# Patient Record
Sex: Female | Born: 1985 | Race: White | Hispanic: No | Marital: Single | Smoking: Current every day smoker
Health system: Southern US, Community
[De-identification: ages and names within clinical notes are randomized; demographics above are authoritative.]

## PROBLEM LIST (undated history)

## (undated) DIAGNOSIS — M549 Dorsalgia, unspecified: Secondary | ICD-10-CM

---

## 2008-08-05 ENCOUNTER — Emergency Department (HOSPITAL_COMMUNITY): Admission: EM | Admit: 2008-08-05 | Discharge: 2008-08-05 | Payer: Self-pay | Admitting: Family Medicine

## 2008-11-16 ENCOUNTER — Emergency Department (HOSPITAL_COMMUNITY): Admission: EM | Admit: 2008-11-16 | Discharge: 2008-11-16 | Payer: Self-pay | Admitting: Emergency Medicine

## 2009-06-17 ENCOUNTER — Emergency Department (HOSPITAL_COMMUNITY): Admission: EM | Admit: 2009-06-17 | Discharge: 2009-06-17 | Payer: Self-pay | Admitting: Emergency Medicine

## 2009-12-15 ENCOUNTER — Ambulatory Visit (HOSPITAL_COMMUNITY): Admission: RE | Admit: 2009-12-15 | Discharge: 2009-12-15 | Payer: Self-pay | Admitting: Obstetrics & Gynecology

## 2009-12-31 ENCOUNTER — Ambulatory Visit (HOSPITAL_COMMUNITY): Admission: RE | Admit: 2009-12-31 | Discharge: 2009-12-31 | Payer: Self-pay | Admitting: Internal Medicine

## 2010-05-28 ENCOUNTER — Inpatient Hospital Stay (HOSPITAL_COMMUNITY): Admission: AD | Admit: 2010-05-28 | Discharge: 2010-05-30 | Payer: Self-pay | Admitting: Obstetrics & Gynecology

## 2011-03-06 LAB — CBC
HCT: 32 % — ABNORMAL LOW (ref 36.0–46.0)
HCT: 35.6 % — ABNORMAL LOW (ref 36.0–46.0)
Hemoglobin: 11.1 g/dL — ABNORMAL LOW (ref 12.0–15.0)
MCHC: 35 g/dL (ref 30.0–36.0)
MCV: 93.5 fL (ref 78.0–100.0)
MCV: 93.8 fL (ref 78.0–100.0)
Platelets: 177 10*3/uL (ref 150–400)
Platelets: 203 10*3/uL (ref 150–400)
RDW: 13.9 % (ref 11.5–15.5)
RDW: 14.1 % (ref 11.5–15.5)
WBC: 17.5 10*3/uL — ABNORMAL HIGH (ref 4.0–10.5)

## 2011-03-26 LAB — URINALYSIS, ROUTINE W REFLEX MICROSCOPIC: Glucose, UA: NEGATIVE mg/dL

## 2011-03-26 LAB — RPR: RPR Ser Ql: NONREACTIVE

## 2011-03-26 LAB — URINE MICROSCOPIC-ADD ON

## 2011-03-26 LAB — WET PREP, GENITAL: Trich, Wet Prep: NONE SEEN

## 2011-03-26 LAB — PREGNANCY, URINE: Preg Test, Ur: NEGATIVE

## 2011-09-06 ENCOUNTER — Ambulatory Visit (INDEPENDENT_AMBULATORY_CARE_PROVIDER_SITE_OTHER): Payer: Self-pay

## 2011-09-06 ENCOUNTER — Inpatient Hospital Stay (INDEPENDENT_AMBULATORY_CARE_PROVIDER_SITE_OTHER)
Admission: RE | Admit: 2011-09-06 | Discharge: 2011-09-06 | Disposition: A | Discharge status: Home / Self Care | Payer: Self-pay | Source: Ambulatory Visit | Attending: Emergency Medicine | Admitting: Emergency Medicine

## 2011-09-06 DIAGNOSIS — M549 Dorsalgia, unspecified: Secondary | ICD-10-CM

## 2011-09-06 DIAGNOSIS — T490X1A Poisoning by local antifungal, anti-infective and anti-inflammatory drugs, accidental (unintentional), initial encounter: Secondary | ICD-10-CM

## 2011-09-06 LAB — POCT URINALYSIS DIP (DEVICE)
Glucose, UA: NEGATIVE mg/dL
Nitrite: NEGATIVE
Protein, ur: NEGATIVE mg/dL
Urobilinogen, UA: 0.2 mg/dL (ref 0.0–1.0)

## 2011-09-06 LAB — HEPATIC FUNCTION PANEL
Albumin: 4.1 g/dL (ref 3.5–5.2)
Alkaline Phosphatase: 95 U/L (ref 39–117)
Total Bilirubin: 0.3 mg/dL (ref 0.3–1.2)

## 2011-09-06 LAB — POCT I-STAT, CHEM 8
Calcium, Ion: 1.29 mmol/L (ref 1.12–1.32)
Glucose, Bld: 99 mg/dL (ref 70–99)
HCT: 44 % (ref 36.0–46.0)
Hemoglobin: 15 g/dL (ref 12.0–15.0)
TCO2: 24 mmol/L (ref 0–100)

## 2011-09-06 LAB — SEDIMENTATION RATE: Sed Rate: 7 mm/hr (ref 0–22)

## 2013-06-02 IMAGING — CR DG THORACIC SPINE 2V
3 series · 3 of 3 positions shown · non-contrast
Comparison: None.

CLINICAL DATA: Back pain

THORACIC SPINE - 2 VIEW

[view not recorded (1 of 3)]
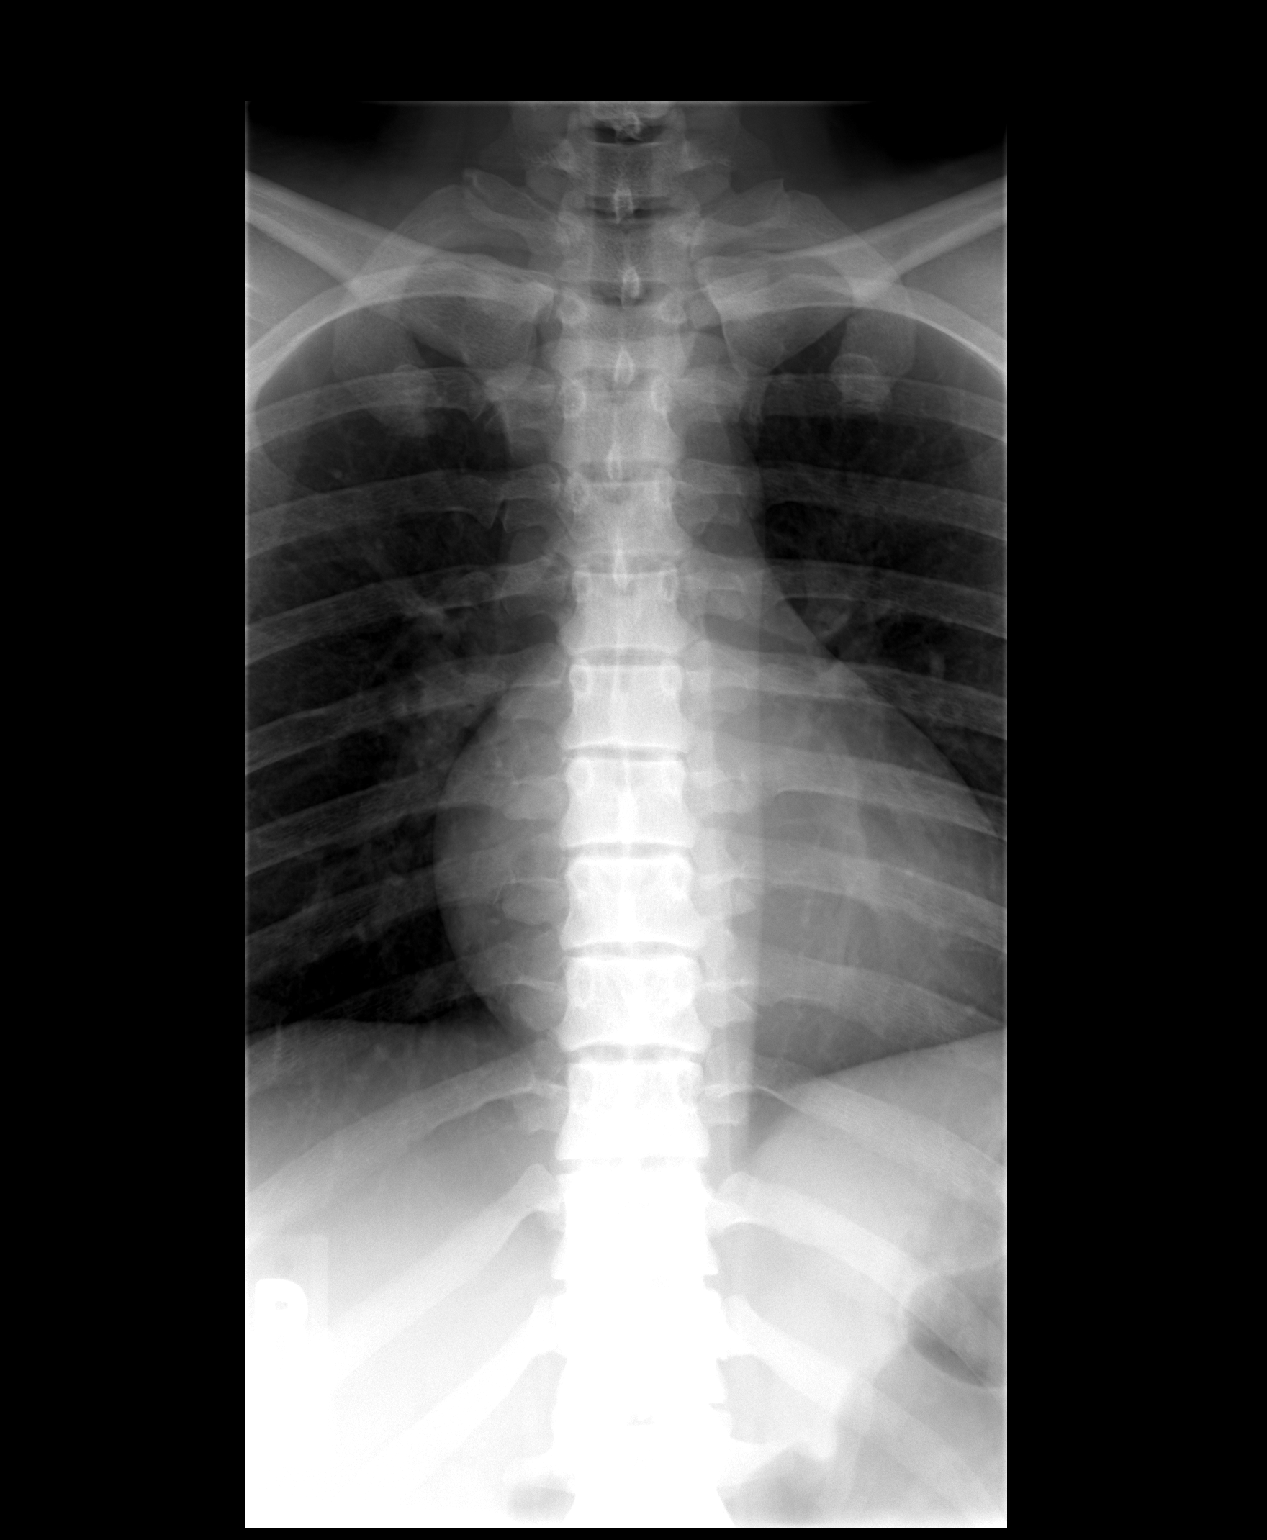

[view not recorded (2 of 3)]
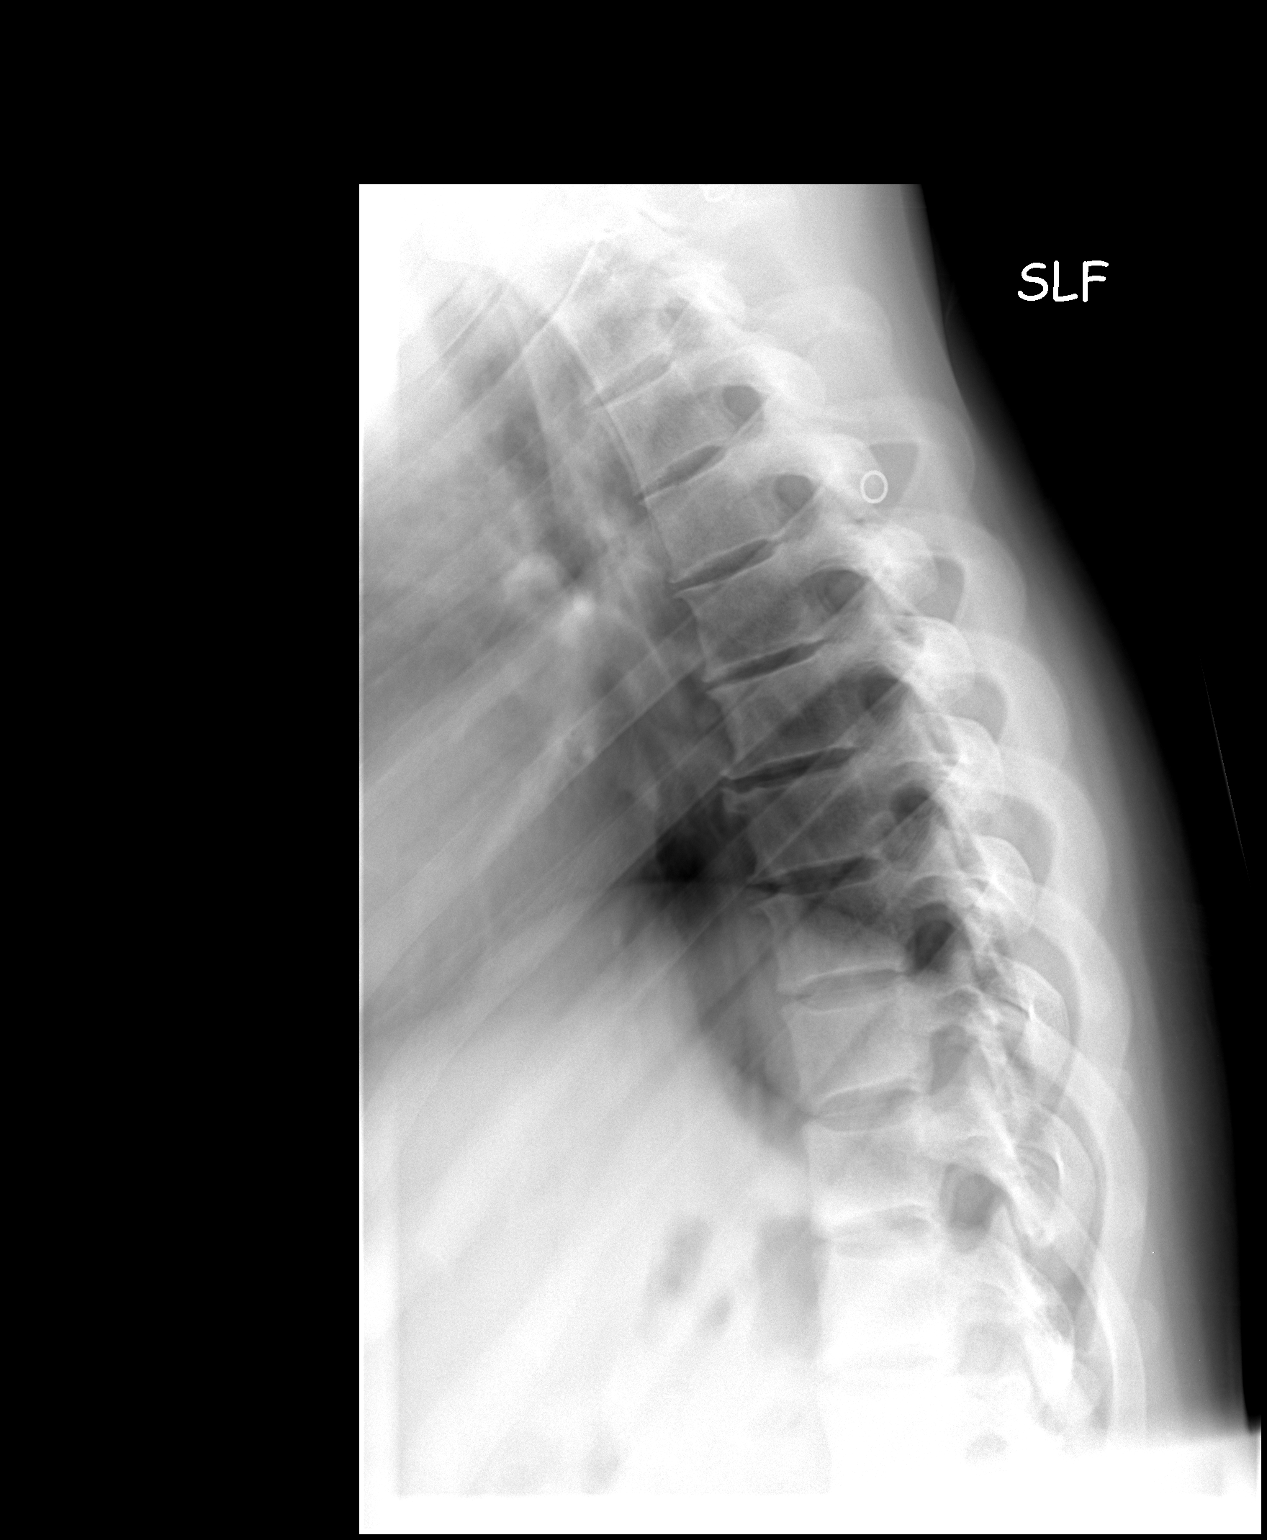

[view not recorded (3 of 3)]
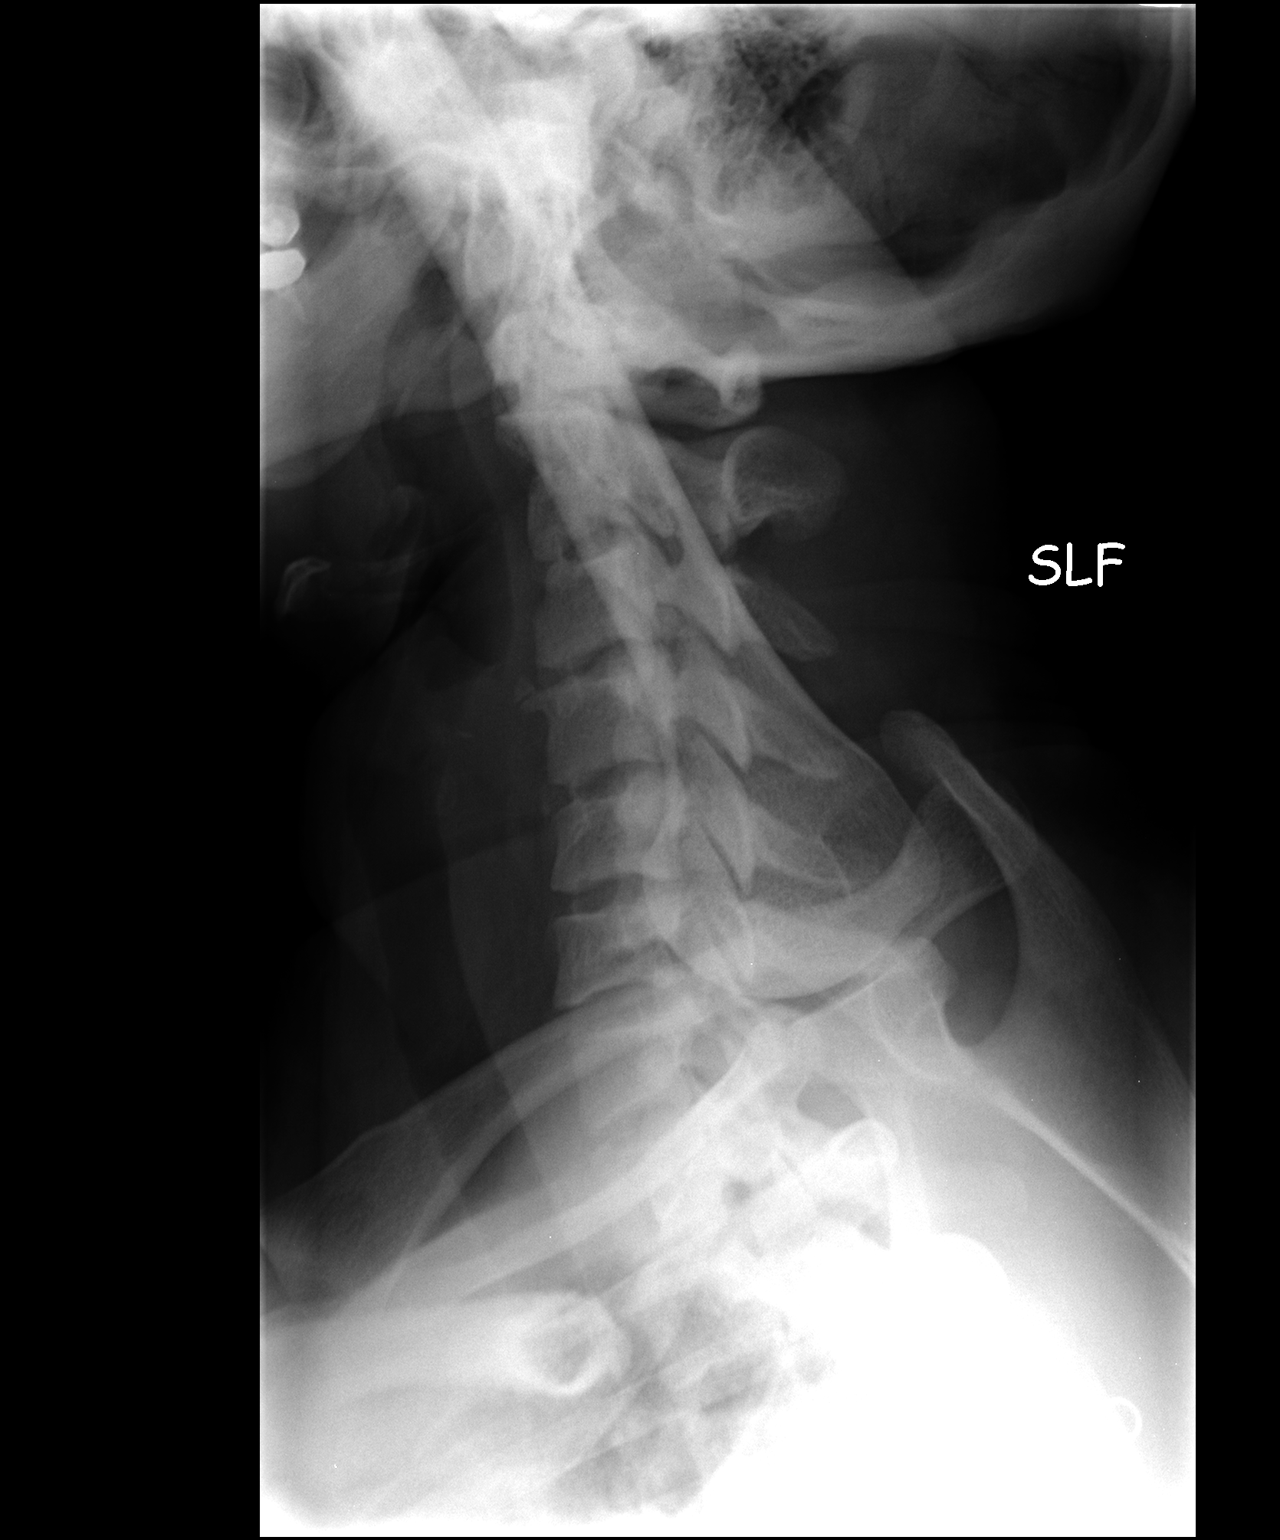

[3 of 3 positions shown; findings below may reference images not displayed]

FINDINGS: Anatomic alignment.  No acute fracture and no
dislocation.
IMPRESSION: No acute bony pathology.

## 2013-12-18 ENCOUNTER — Encounter (HOSPITAL_COMMUNITY): Payer: Self-pay | Admitting: Emergency Medicine

## 2013-12-18 ENCOUNTER — Emergency Department (INDEPENDENT_AMBULATORY_CARE_PROVIDER_SITE_OTHER)
Admission: EM | Admit: 2013-12-18 | Discharge: 2013-12-18 | Disposition: A | Discharge status: Home / Self Care | Payer: Self-pay | Source: Home / Self Care | Attending: Emergency Medicine | Admitting: Emergency Medicine

## 2013-12-18 DIAGNOSIS — N390 Urinary tract infection, site not specified: Secondary | ICD-10-CM

## 2013-12-18 LAB — POCT URINALYSIS DIP (DEVICE)
Bilirubin Urine: NEGATIVE
Glucose, UA: NEGATIVE mg/dL
KETONES UR: NEGATIVE mg/dL
Nitrite: POSITIVE — AB
PROTEIN: NEGATIVE mg/dL
Specific Gravity, Urine: >=1.03 (ref 1.005–1.030)
Urobilinogen, UA: 0.2 mg/dL (ref 0.0–1.0)
pH: 5.5 (ref 5.0–8.0)

## 2013-12-18 LAB — POCT PREGNANCY, URINE: Preg Test, Ur: NEGATIVE

## 2013-12-18 MED ORDER — PHENAZOPYRIDINE HCL 200 MG PO TABS: 200.0000 mg | ORAL_TABLET | Freq: Three times a day (TID) | ORAL | Status: DC

## 2013-12-18 MED ORDER — PHENAZOPYRIDINE HCL 200 MG PO TABS
200.0000 mg | ORAL_TABLET | Freq: Three times a day (TID) | ORAL | Status: DC
Start: 2013-12-18 — End: 2013-12-18

## 2013-12-18 MED ORDER — CEPHALEXIN 500 MG PO CAPS: 1000.0000 mg | ORAL_CAPSULE | Freq: Two times a day (BID) | ORAL | Status: DC

## 2013-12-18 NOTE — ED Provider Notes (Signed)
CSN: [id number]     Arrival date & time 12/18/13  1109 History   First MD Initiated Contact with Patient 12/18/13 1209     Chief Complaint  Patient presents with  . Urinary Tract Infection   (Consider location/radiation/quality/duration/timing/severity/associated sxs/prior Treatment) HPI Comments:  28 year old female presents complaining of probable urinary tract infection. Starting last night, she started to have pain and burning with urination, urinary urgency, urinary frequency, and some mild lower abdominal discomfort. She has had a urinary tract in the past that started out just like this. She denies fever, chills, flank pain, NVD. Denies risk factors for STD   History reviewed. No pertinent past medical history. No past surgical history on file. No family history on file. History  Substance Use Topics  . Smoking status: Not on file  . Smokeless tobacco: Not on file  . Alcohol Use: Not on file   OB History   Grav Para Term Preterm Abortions TAB SAB Ect Mult Living                 Review of Systems  Constitutional: Negative for fever and chills.  Eyes: Negative for visual disturbance.  Respiratory: Negative for cough and shortness of breath.   Cardiovascular: Negative for chest pain, palpitations and leg swelling.  Gastrointestinal: Negative for nausea, vomiting and abdominal pain.  Endocrine: Negative for polydipsia and polyuria.  Genitourinary: Positive for dysuria, urgency and frequency. Negative for hematuria, flank pain, decreased urine volume, vaginal discharge, difficulty urinating and genital sores.  Musculoskeletal: Negative for arthralgias and myalgias.  Skin: Negative for rash.  Neurological: Negative for dizziness, weakness and light-headedness.    Allergies  Review of patient's allergies indicates no known allergies.  Home Medications   Current Outpatient Rx  Name  Route  Sig  Dispense  Refill  . cephALEXin (KEFLEX) 500 MG capsule   Oral   Take 2  capsules (1,000 mg total) by mouth 2 (two) times daily.   20 capsule   0   . phenazopyridine (PYRIDIUM) 200 MG tablet   Oral   Take 1 tablet (200 mg total) by mouth 3 (three) times daily.   6 tablet   0    BP 128/70  Pulse 70  Temp(Src) 98.6 F (37 C) (Oral)  Resp 16  SpO2 100%  LMP 12/17/2013 Physical Exam  Nursing note and vitals reviewed. Constitutional: She is oriented to person, place, and time. Vital signs are normal. She appears well-developed and well-nourished. No distress.  HENT:  Head: Normocephalic and atraumatic.  Pulmonary/Chest: Effort normal. No respiratory distress.  Abdominal: Soft. There is no tenderness. There is no CVA tenderness.  Neurological: She is alert and oriented to person, place, and time. She has normal strength. Coordination normal.  Skin: Skin is warm and dry. No rash noted. She is not diaphoretic.  Psychiatric: She has a normal mood and affect. Judgment normal.    ED Course  Procedures (including critical care time) Labs Review Labs Reviewed  POCT URINALYSIS DIP (DEVICE) - Abnormal; Notable for the following:    Hgb urine dipstick MODERATE (*)    Nitrite POSITIVE (*)    Leukocytes, UA SMALL (*)    All other components within normal limits  URINE CULTURE  POCT PREGNANCY, URINE   Imaging Review No results found.    MDM   1. UTI (lower urinary tract infection)    Urinalysis indicates a UTI. Culture sent. Treat with Keflex and Pyridium. Followup when necessary.  Meds ordered this encounter  Medications  . cephALEXin (KEFLEX) 500 MG capsule    Sig: Take 2 capsules (1,000 mg total) by mouth 2 (two) times daily.    Dispense:  20 capsule    Refill:  0  . phenazopyridine (PYRIDIUM) 200 MG tablet    Sig: Take 1 tablet (200 mg total) by mouth 3 (three) times daily.    Dispense:  6 tablet    Refill:  0       Graylon GoodZachary H Acsa Estey, PA-C 12/18/13 1237

## 2013-12-18 NOTE — ED Notes (Signed)
C/o uti which started last night States she is having some burning when urinating, the urgency and frequency feeling and also abd discomfort.  No treatments tried.

## 2013-12-18 NOTE — Discharge Instructions (Signed)
Urinary Tract Infection  Urinary tract infections (UTIs) can develop anywhere along your urinary tract. Your urinary tract is your body's drainage system for removing wastes and extra water. Your urinary tract includes two kidneys, two ureters, a bladder, and a urethra. Your kidneys are a pair of bean-shaped organs. Each kidney is about the size of your fist. They are located below your ribs, one on each side of your spine.  CAUSES  Infections are caused by microbes, which are microscopic organisms, including fungi, viruses, and bacteria. These organisms are so small that they can only be seen through a microscope. Bacteria are the microbes that most commonly cause UTIs.  SYMPTOMS   Symptoms of UTIs may vary by age and gender of the patient and by the location of the infection. Symptoms in young women typically include a frequent and intense urge to urinate and a painful, burning feeling in the bladder or urethra during urination. Older women and men are more likely to be tired, shaky, and weak and have muscle aches and abdominal pain. A fever may mean the infection is in your kidneys. Other symptoms of a kidney infection include pain in your back or sides below the ribs, nausea, and vomiting.  DIAGNOSIS  To diagnose a UTI, your caregiver will ask you about your symptoms. Your caregiver also will ask to provide a urine sample. The urine sample will be tested for bacteria and white blood cells. White blood cells are made by your body to help fight infection.  TREATMENT   Typically, UTIs can be treated with medication. Because most UTIs are caused by a bacterial infection, they usually can be treated with the use of antibiotics. The choice of antibiotic and length of treatment depend on your symptoms and the type of bacteria causing your infection.  HOME CARE INSTRUCTIONS   If you were prescribed antibiotics, take them exactly as your caregiver instructs you. Finish the medication even if you feel better after you  have only taken some of the medication.   Drink enough water and fluids to keep your urine clear or pale yellow.   Avoid caffeine, tea, and carbonated beverages. They tend to irritate your bladder.   Empty your bladder often. Avoid holding urine for long periods of time.   Empty your bladder before and after sexual intercourse.   After a bowel movement, women should cleanse from front to back. Use each tissue only once.  SEEK MEDICAL CARE IF:    You have back pain.   You develop a fever.   Your symptoms do not begin to resolve within 3 days.  SEEK IMMEDIATE MEDICAL CARE IF:    You have severe back pain or lower abdominal pain.   You develop chills.   You have nausea or vomiting.   You have continued burning or discomfort with urination.  MAKE SURE YOU:    Understand these instructions.   Will watch your condition.   Will get help right away if you are not doing well or get worse.  Document Released: 09/13/2005 Document Revised: 06/04/2012 Document Reviewed: 01/12/2012  [employer] Patient Information 2014 [employer].

## 2013-12-18 NOTE — ED Provider Notes (Signed)
Medical screening examination/treatment/procedure(s) were performed by non-physician practitioner and as supervising physician I was immediately available for consultation/collaboration.  Leslee Homeavid Tigerlily Christine, M.D.  Reuben Likesavid C Lucillie Kiesel, MD 12/18/13 2025

## 2013-12-20 LAB — URINE CULTURE

## 2013-12-20 NOTE — ED Notes (Signed)
Urine culture: >100,000 colonies E. Coli.  Pt. adequately treated with Keflex. Vassie MoselleYork, Carlissa Pesola M 12/20/2013

## 2014-10-14 ENCOUNTER — Emergency Department (HOSPITAL_COMMUNITY): Payer: Medicaid Other

## 2014-10-14 ENCOUNTER — Encounter (HOSPITAL_COMMUNITY): Payer: Self-pay | Admitting: Emergency Medicine

## 2014-10-14 ENCOUNTER — Emergency Department (HOSPITAL_COMMUNITY)
Admission: EM | Admit: 2014-10-14 | Discharge: 2014-10-14 | Disposition: A | Discharge status: Home / Self Care | Payer: Medicaid Other | Attending: Emergency Medicine | Admitting: Emergency Medicine

## 2014-10-14 ENCOUNTER — Other Ambulatory Visit: Payer: Self-pay

## 2014-10-14 DIAGNOSIS — M549 Dorsalgia, unspecified: Secondary | ICD-10-CM | POA: Diagnosis not present

## 2014-10-14 DIAGNOSIS — R000 Tachycardia, unspecified: Secondary | ICD-10-CM | POA: Diagnosis not present

## 2014-10-14 DIAGNOSIS — R079 Chest pain, unspecified: Secondary | ICD-10-CM | POA: Insufficient documentation

## 2014-10-14 DIAGNOSIS — R52 Pain, unspecified: Secondary | ICD-10-CM

## 2014-10-14 DIAGNOSIS — Z3202 Encounter for pregnancy test, result negative: Secondary | ICD-10-CM | POA: Diagnosis not present

## 2014-10-14 DIAGNOSIS — Z720 Tobacco use: Secondary | ICD-10-CM | POA: Diagnosis not present

## 2014-10-14 HISTORY — DX: Dorsalgia, unspecified: M54.9

## 2014-10-14 LAB — CBC
HEMATOCRIT: 39.2 % (ref 36.0–46.0)
Hemoglobin: 13.1 g/dL (ref 12.0–15.0)
MCH: 30.1 pg (ref 26.0–34.0)
MCHC: 33.4 g/dL (ref 30.0–36.0)
MCV: 90.1 fL (ref 78.0–100.0)
Platelets: 220 10*3/uL (ref 150–400)
RBC: 4.35 MIL/uL (ref 3.87–5.11)
RDW: 13 % (ref 11.5–15.5)
WBC: 9.5 10*3/uL (ref 4.0–10.5)

## 2014-10-14 LAB — BASIC METABOLIC PANEL
Anion gap: 13 (ref 5–15)
BUN: 11 mg/dL (ref 6–23)
CALCIUM: 9.2 mg/dL (ref 8.4–10.5)
CO2: 21 mEq/L (ref 19–32)
Chloride: 101 mEq/L (ref 96–112)
Creatinine, Ser: 0.62 mg/dL (ref 0.50–1.10)
GFR calc Af Amer: >90 mL/min (ref >90)
GLUCOSE: 93 mg/dL (ref 70–99)
Potassium: 4.3 mEq/L (ref 3.7–5.3)
Sodium: 135 mEq/L — ABNORMAL LOW (ref 137–147)

## 2014-10-14 LAB — D-DIMER, QUANTITATIVE: D-Dimer, Quant: 1.27 ug/mL-FEU — ABNORMAL HIGH (ref 0.00–0.48)

## 2014-10-14 LAB — I-STAT TROPONIN, ED: Troponin i, poc: 0.01 ng/mL (ref 0.00–0.08)

## 2014-10-14 LAB — POC URINE PREG, ED: Preg Test, Ur: NEGATIVE

## 2014-10-14 MED ORDER — MORPHINE SULFATE 4 MG/ML IJ SOLN: 4.0000 mg | Freq: Once | INTRAMUSCULAR | Status: DC

## 2014-10-14 MED ORDER — MELOXICAM 7.5 MG PO TABS: 15.0000 mg | ORAL_TABLET | Freq: Every day | ORAL | Status: DC

## 2014-10-14 MED ORDER — IOHEXOL 350 MG/ML SOLN
100.0000 mL | Freq: Once | INTRAVENOUS | Status: AC | PRN
  Administered 2014-10-14: 80 mL via INTRAVENOUS

## 2014-10-14 MED ORDER — KETOROLAC TROMETHAMINE 30 MG/ML IJ SOLN
30.0000 mg | Freq: Once | INTRAMUSCULAR | Status: AC
  Administered 2014-10-14: 30 mg via INTRAVENOUS
  Filled 2014-10-14: qty 1

## 2014-10-14 NOTE — ED Provider Notes (Signed)
CSN: [id number]     Arrival date & time 10/14/14  1015 History   First MD Initiated Contact with Patient 10/14/14 1036     Chief Complaint  Patient presents with  . Chest Pain  . Back Pain     (Consider location/radiation/quality/duration/timing/severity/associated sxs/prior Treatment) HPI Comments: Patient is a 28 yo F PMHx significant for back pain, tobacco abuse presenting to the ED for central sharp constant chest pain that is worse with inspiration. Patient states her pain began last evening, it has improved mildly with time and Advil PM. No aggravating factors. Patient states she is unsure if her pain is radiating through from her back or not. No history of CP, PE, DVT. No early familial cardiac risk factors. Denies any fever or chills.   Patient is a 28 y.o. female presenting with chest pain and back pain.  Chest Pain Associated symptoms: back pain   Associated symptoms: no shortness of breath   Back Pain Associated symptoms: chest pain     Past Medical History  Diagnosis Date  . Back pain    History reviewed. No pertinent past surgical history. No family history on file. History  Substance Use Topics  . Smoking status: Current Every Day Smoker -- 1.00 packs/day    Types: Cigarettes  . Smokeless tobacco: Not on file  . Alcohol Use: Yes   OB History   Grav Para Term Preterm Abortions TAB SAB Ect Mult Living                 Review of Systems  Respiratory: Negative for shortness of breath.   Cardiovascular: Positive for chest pain. Negative for leg swelling.  Musculoskeletal: Positive for back pain.  All other systems reviewed and are negative.     Allergies  Review of patient's allergies indicates no known allergies.  Home Medications   Prior to Admission medications   Medication Sig Start Date End Date Taking? Authorizing Provider  Ibuprofen-Diphenhydramine HCl (ADVIL PM) 200-25 MG CAPS Take 1 tablet by mouth at bedtime as needed (for sleep).    Yes  Historical Provider, MD  meloxicam (MOBIC) 7.5 MG tablet Take 2 tablets (15 mg total) by mouth daily. 10/14/14   Kahli Mayon L Demetrias Goodbar, PA-C   BP 105/60  Pulse 90  Temp(Src) 98.4 F (36.9 C) (Oral)  Resp 18  Ht 5\' 6"  (1.676 m)  Wt 185 lb (83.915 kg)  BMI 29.87 kg/m2  SpO2 98%  LMP 10/04/2014 Physical Exam  Nursing note and vitals reviewed. Constitutional: She is oriented to person, place, and time. She appears well-developed and well-nourished. No distress.  HENT:  Head: Normocephalic and atraumatic.  Right Ear: External ear normal.  Left Ear: External ear normal.  Nose: Nose normal.  Mouth/Throat: Oropharynx is clear and moist.  Eyes: Conjunctivae are normal.  Neck: Neck supple.  Cardiovascular: Regular rhythm, normal heart sounds and intact distal pulses.  Tachycardia present.   Pulmonary/Chest: Effort normal and breath sounds normal. No respiratory distress. She exhibits no tenderness.  Abdominal: Soft. There is no tenderness.  Musculoskeletal: Normal range of motion. She exhibits no edema.  Neurological: She is alert and oriented to person, place, and time.  Skin: Skin is warm and dry. She is not diaphoretic.    ED Course  Procedures (including critical care time) Medications  iohexol (OMNIPAQUE) 350 MG/ML injection 100 mL (80 mLs Intravenous Contrast Given 10/14/14 1338)  ketorolac (TORADOL) 30 MG/ML injection 30 mg (30 mg Intravenous Given 10/14/14 1451)    Labs Review  Labs Reviewed  BASIC METABOLIC PANEL - Abnormal; Notable for the following:    Sodium 135 (*)    All other components within normal limits  D-DIMER, QUANTITATIVE - Abnormal; Notable for the following:    D-Dimer, Quant 1.27 (*)    All other components within normal limits  CBC  I-STAT TROPOININ, ED  POC URINE PREG, ED    Imaging Review Dg Chest 2 View  10/14/2014   CLINICAL DATA:  Chest pain for 1 day.  Cough  EXAM: CHEST  2 VIEW  COMPARISON:  None.  FINDINGS: Lungs are clear. Heart size  and pulmonary vascularity are normal. No adenopathy. No pneumothorax. No bone lesions.  IMPRESSION: No abnormality noted.   Electronically Signed   By: Bretta BangWilliam  Woodruff M.D.   On: 10/14/2014 11:26   Ct Angio Chest W/cm &/or Wo Cm  10/14/2014   CLINICAL DATA:  Chest pain.  EXAM: CT ANGIOGRAPHY CHEST WITH CONTRAST  TECHNIQUE: Multidetector CT imaging of the chest was performed using the standard protocol during bolus administration of intravenous contrast. Multiplanar CT image reconstructions and MIPs were obtained to evaluate the vascular anatomy.  CONTRAST:  80mL OMNIPAQUE IOHEXOL 350 MG/ML SOLN  COMPARISON:  Chest x-ray dated 10/14/2014  FINDINGS: There are no pulmonary emboli, infiltrates, or effusions. Heart is normal. No mediastinal or hilar adenopathy. Osseous structures are normal.  Review of the MIP images confirms the above findings.  IMPRESSION: Normal exam.   Electronically Signed   By: Geanie CooleyJim  Maxwell M.D.   On: 10/14/2014 14:05     EKG Interpretation None      Discuss elevated d-dimer with patient, discussed that we will obtain a CT angio chest for evaluation of elevated d-dimer for possible pulmonary embolism. Patient is agreeable to plan.  MDM   Final diagnoses:  Chest pain    Filed Vitals:   10/14/14 1515  BP: 105/60  Pulse: 90  Temp:   Resp: 18   Afebrile, NAD, non-toxic appearing, AAOx4.  Patient is to be discharged with recommendation to follow up with PCP in regards to today's hospital visit. Chest pain is not likely of cardiac or pulmonary etiology d/t presentation, D-dimer elevated, but CT angio chest negative for PE or other acute cardiopulmonary findings, VSS, no tracheal deviation, no JVD or new murmur, RRR, breath sounds equal bilaterally, EKG without acute abnormalities, negative troponin, and negative CXR. Pt has been advised to return to the ED is CP becomes exertional, associated with diaphoresis or nausea, radiates to left jaw/arm, worsens or becomes concerning  in any way. Pt appears reliable for follow up and is agreeable to discharge.   Case has been discussed with Dr. Fayrene FearingJames who agrees with the above plan to discharge.       Jeannetta EllisJennifer L Alyssha Housh, PA-C 10/14/14 1610  Rolland PorterMark James, MD 10/23/14 [phone]

## 2014-10-14 NOTE — ED Notes (Signed)
Patient returned from CT

## 2014-10-14 NOTE — ED Notes (Signed)
Patient states she has chronic back pain, but that last night it was worsened "and seemed to shoot straight through my chest".   Patient states still has mild chest pain at this time.   Patient states worse when she "breathes in".   Patient denies other symptoms.

## 2014-10-14 NOTE — ED Notes (Signed)
Catherine Ortega at bedside

## 2014-10-14 NOTE — ED Notes (Signed)
Patient transported to X-ray 

## 2014-10-14 NOTE — ED Notes (Signed)
Back issue for past 4 years, pain in one area constant.  Last pm it seems pain was shooting from that area through her chest, worse with inspiration.  Pain has decreased since this am.  Rates pain 0/10 in discomfort, rates pain as a 5 or 6/10 when it was at its worse.   No pain in legs, has had cough, no fever or chills, no nausea or shortness of breath, no sweating, light headedness or dizziness.

## 2014-10-14 NOTE — Discharge Instructions (Signed)
Please follow up with your primary care physician in 1-2 days. If you do not have one please call the [facility] number listed above. Please take medications as prescribed. Please read all discharge instructions and return precautions.   Chest Pain (Nonspecific) It is often hard to give a specific diagnosis for the cause of chest pain. There is always a chance that your pain could be related to something serious, such as a heart attack or a blood clot in the lungs. You need to follow up with your health care provider for further evaluation. CAUSES   Heartburn.  Pneumonia or bronchitis.  Anxiety or stress.  Inflammation around your heart (pericarditis) or lung (pleuritis or pleurisy).  A blood clot in the lung.  A collapsed lung (pneumothorax). It can develop suddenly on its own (spontaneous pneumothorax) or from trauma to the chest.  Shingles infection (herpes zoster virus). The chest wall is composed of bones, muscles, and cartilage. Any of these can be the source of the pain.  The bones can be bruised by injury.  The muscles or cartilage can be strained by coughing or overwork.  The cartilage can be affected by inflammation and become sore (costochondritis). DIAGNOSIS  Lab tests or other studies may be needed to find the cause of your pain. Your health care provider may have you take a test called an ambulatory electrocardiogram (ECG). An ECG records your heartbeat patterns over a 24-hour period. You may also have other tests, such as:  Transthoracic echocardiogram (TTE). During echocardiography, sound waves are used to evaluate how blood flows through your heart.  Transesophageal echocardiogram (TEE).  Cardiac monitoring. This allows your health care provider to monitor your heart rate and rhythm in real time.  Holter monitor. This is a portable device that records your heartbeat and can help diagnose heart arrhythmias. It allows your health care provider to  track your heart activity for several days, if needed.  Stress tests by exercise or by giving medicine that makes the heart beat faster. TREATMENT   Treatment depends on what may be causing your chest pain. Treatment may include:  Acid blockers for heartburn.  Anti-inflammatory medicine.  Pain medicine for inflammatory conditions.  Antibiotics if an infection is present.  You may be advised to change lifestyle habits. This includes stopping smoking and avoiding alcohol, caffeine, and chocolate.  You may be advised to keep your head raised (elevated) when sleeping. This reduces the chance of acid going backward from your stomach into your esophagus. Most of the time, nonspecific chest pain will improve within 2-3 days with rest and mild pain medicine.  HOME CARE INSTRUCTIONS   If antibiotics were prescribed, take them as directed. Finish them even if you start to feel better.  For the next few days, avoid physical activities that bring on chest pain. Continue physical activities as directed.  Do not use any tobacco products, including cigarettes, chewing tobacco, or electronic cigarettes.  Avoid drinking alcohol.  Only take medicine as directed by your health care provider.  Follow your health care provider's suggestions for further testing if your chest pain does not go away.  Keep any follow-up appointments you made. If you do not go to an appointment, you could develop lasting (chronic) problems with pain. If there is any problem keeping an appointment, call to reschedule. SEEK MEDICAL CARE IF:   Your chest pain does not go away, even after treatment.  You have a rash with blisters on your chest.  You have a fever. SEEK IMMEDIATE MEDICAL CARE IF:   You have increased chest pain or pain that spreads to your arm, neck, jaw, back, or abdomen.  You have shortness of breath.  You have an increasing cough, or you cough up blood.  You have severe back or abdominal  pain.  You feel nauseous or vomit.  You have severe weakness.  You faint.  You have chills. This is an emergency. Do not wait to see if the pain will go away. Get medical help at once. Call your local emergency services (911 in [place] Do not drive yourself to the hospital. MAKE SURE YOU:   Understand these instructions.  Will watch your condition.  Will get help right away if you are not doing well or get worse. Document Released: 09/13/2005 Document Revised: 12/09/2013 Document Reviewed: 07/09/2008 [facility] Patient Information 2015 [employer] [place]. This information is not intended to replace advice given to you by your health care provider. Make sure you discuss any questions you have with your health care provider.

## 2014-10-14 NOTE — ED Notes (Signed)
Patient transported to CT 

## 2014-11-30 ENCOUNTER — Ambulatory Visit: Payer: Medicaid Other | Attending: Family Medicine | Admitting: Physical Therapy

## 2015-10-12 ENCOUNTER — Telehealth: Payer: Self-pay

## 2015-10-12 ENCOUNTER — Ambulatory Visit: Payer: Medicaid Other | Admitting: Neurology

## 2015-10-12 NOTE — Telephone Encounter (Signed)
Left message for patient, stating that we have an opening 10/19/15 at 10am and can move her appt up if she would like.

## 2015-10-12 NOTE — Telephone Encounter (Signed)
Dr. Frances FurbishAthar is not here due to emergency. I was able to work patient in on 15th. But will keep eye out for sooner appt.

## 2015-10-14 NOTE — Telephone Encounter (Signed)
I spoke to patient and she wanted to move appt up to 11/1. She is aware of new date and time.

## 2015-10-19 ENCOUNTER — Ambulatory Visit (INDEPENDENT_AMBULATORY_CARE_PROVIDER_SITE_OTHER): Payer: Medicaid Other | Admitting: Neurology

## 2015-10-19 ENCOUNTER — Encounter: Payer: Self-pay | Admitting: Neurology

## 2015-10-19 VITALS — BP 112/80 | HR 68 | Resp 16 | Ht 66.0 in | Wt 190.0 lb

## 2015-10-19 DIAGNOSIS — Z8739 Personal history of other diseases of the musculoskeletal system and connective tissue: Secondary | ICD-10-CM

## 2015-10-19 DIAGNOSIS — M549 Dorsalgia, unspecified: Secondary | ICD-10-CM

## 2015-10-19 DIAGNOSIS — M546 Pain in thoracic spine: Secondary | ICD-10-CM

## 2015-10-19 DIAGNOSIS — M545 Low back pain, unspecified: Secondary | ICD-10-CM

## 2015-10-19 NOTE — Patient Instructions (Addendum)
For your back pain we will MRI upper and lower back for work up. You may benefit from seeing a spine specialist such as a neurosurgeon or orthopedic doctor.  Your neurological exam looks good.  We will call you with your results and where to go from here.

## 2015-10-19 NOTE — Progress Notes (Signed)
Subjective:    Patient ID: Catherine Ortega is a 29 y.o. female.  HPI     [name], MD, PhD [facility] [address], [address] [address] [place], [place] [id number]  Dear Dr. Leavy CellaBoyd,   I saw your patient, Catherine RouteKari Ortega, upon your kind request in my neurologic clinic today for initial consultation of her paresthesias. The patient is unaccompanied today. As you know, Ms. Catherine Ortega is a 29 year old right-handed woman with an underlying medical history of tinnitus, smoking, and back pain, who reports pain around the scapula on the right. She reports back pain since her teenage years. She has a history of scoliosis. She was given exercises for her scoliosis. Her back pain became worse after she had a child some 5 years ago. She had a lumbar spine x-ray in September 2012 which showed very minimal degenerative disc disease of L5-S1.  She has LBP for years, and for 6 years she has had upper back pain in the R scapula area. She tries to exercise. She tried Mobic, which did not help and OTC NSAIDs, not very effective. She had hydrocodone for a tooth abscess, but it did not help much either. Standing or sitting makes the upper back pain worse, walking and stretching makes it better. She could not do PT d/t insurance. She went to a chiropractor who told her that she would need orthotics. She could not afford them. She has never had a MRI of her back. She has not seen an orthopedic doctor yet. She smokes, about three quarters of a pack per day, usually less than 1 pack per day. She drinks alcohol infrequently. She drinks 1-2 cups of coffee per day. 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 She lives with her 31-year-old son. I reviewed your office note from 09/06/2015, which you kindly included. Lower back pain is usually in the midline, sometimes radiating to both hips.  Her Past Medical History Is Significant For: Past Medical History  Diagnosis Date  . Back pain     Her  Past Surgical History Is Significant For: No past surgical history on file.  Her Family History Is Significant For: Family History  Problem Relation Age of Onset  . Testicular cancer Father   . Hypertension Father   . Hypothyroidism Father   . Hypercholesterolemia Father     Her Social History Is Significant For: Social History   Social History  . Marital Status: Single    Spouse Name: N/A  . Number of Children: 1  . Years of Education: College   Occupational History  . Student     Social History Main Topics  . Smoking status: Current Every Day Smoker -- 1.00 packs/day    Types: Cigarettes  . Smokeless tobacco: None  . Alcohol Use: 0.0 oz/week    0 Standard drinks or equivalent per week  . Drug Use: No  . Sexual Activity: Not Asked   Other Topics Concern  . None   Social History Narrative   Caffeine: 1-2 cups a day     Her Allergies Are:  No Known Allergies:   Her Current Medications Are:  Outpatient Encounter Prescriptions as of 10/19/2015  Medication Sig  . HYDROcodone-acetaminophen (NORCO/VICODIN) 5-325 MG tablet take 1-2 tablets by mouth every 6 hours if needed for pain  . [DISCONTINUED] meloxicam (MOBIC) 7.5 MG tablet Take 2 tablets (15 mg total) by mouth daily.   No facility-administered encounter medications on file as of 10/19/2015.  :  Review of Systems:  Out of a complete 14 point review of systems, all are reviewed and negative with the exception of these symptoms as listed below:   Review of Systems  Constitutional: Positive for fatigue.  HENT: Positive for tinnitus.   Musculoskeletal:       Patient reports having back pain since early teenage years. Denies any injury. States that a physician told her that she has "slight case" of scoliosis. Back pain has become worse since having her child 5 years ago. Denies any pain or numbness in legs.   Psychiatric/Behavioral:       Decreased energy     Objective:  Neurologic Exam  Physical  Exam Physical Examination:   Filed Vitals:   10/19/15 0959  BP: 112/80  Pulse: 68  Resp: 16    General Examination: The patient is a very pleasant 29 y.o. female in no acute distress. She appears well-developed and well-nourished and well groomed.   HEENT: Normocephalic, atraumatic, pupils are equal, round and reactive to light and accommodation. Funduscopic exam is normal with sharp disc margins noted. Extraocular tracking is good without limitation to gaze excursion or nystagmus noted. Normal smooth pursuit is noted. Hearing is grossly intact. Tympanic membranes are clear bilaterally. Face is symmetric with normal facial animation and normal facial sensation. Speech is clear with no dysarthria noted. There is no hypophonia. There is no lip, neck/head, jaw or voice tremor. Neck is supple with full range of passive and active motion. There are no carotid bruits on auscultation. Oropharynx exam reveals: mild mouth dryness, adequate dental hygiene and no significant airway crowding. Mallampati is class I. Tongue protrudes centrally and palate elevates symmetrically.   Chest: Clear to auscultation without wheezing, rhonchi or crackles noted.  Heart: S1+S2+0, regular and normal without murmurs, rubs or gallops noted.   Abdomen: Soft, non-tender and non-distended with normal bowel sounds appreciated on auscultation.  Extremities: There is no pitting edema in the distal lower extremities bilaterally. Pedal pulses are intact.  Skin: Warm and dry without trophic changes noted. There are no varicose veins.  Musculoskeletal: exam reveals no obvious joint deformities, tenderness or joint swelling or erythema. She may have very mild right convex upper back scoliosis. She has no particular tenderness in the right scapular area and no numbness.   Neurologically:  Mental status: The patient is awake, alert and oriented in all 4 spheres. Her immediate and remote memory, attention, language skills and fund  of knowledge are appropriate. There is no evidence of aphasia, agnosia, apraxia or anomia. Speech is clear with normal prosody and enunciation. Thought process is linear. Mood is normal and affect is normal.  Cranial nerves II - XII are as described above under HEENT exam. In addition: shoulder shrug is normal with equal shoulder height noted. Motor exam: Normal bulk, strength and tone is noted. There is no drift, tremor or rebound. Romberg is negative. Reflexes are 2+ throughout. Babinski: Toes are flexor bilaterally. Fine motor skills and coordination: intact with normal finger taps, normal hand movements, normal rapid alternating patting, normal foot taps and normal foot agility.  Cerebellar testing: No dysmetria or intention tremor on finger to nose testing. Heel to shin is unremarkable bilaterally. There is no truncal or gait ataxia.  Sensory exam: intact to light touch, pinprick, vibration, temperature sense in the upper and lower extremities.  Gait, station and balance: She stands easily. No veering to one side is noted. No leaning to one side is noted. Posture is age-appropriate and stance  is narrow based. Gait shows normal stride length and normal pace. No problems turning are noted. She turns en bloc. Tandem walk is unremarkable.   Assessment and plan:   In summary, HINATA DIENER is a very pleasant 29 y.o.-year old female with an underlying medical history of tinnitus, smoking, and back pain, who reports pain around the scapula on the right. She reports back pain since her teenage years. She has a benign neurological exam. She may benefit from seeing a spine specialist. I suggested that we proceed with an MRI of the cervical and lumbar spine and we will call her with her test results and where to go from here. Again, she has no numbness, no weakness, and intact fine motor skills, intact range of motion, and intact as well as symmetrical reflexes. From the neurological standpoint there may not be  much more that I can add. Nevertheless, we will proceed with imaging testing and call her with the results and see where we need to go from there. I answered all her questions today and she was in agreement. For symptomatic treatment she is encouraged to continue with her exercises, and use over-the-counter nonsteroidal pain medications or Tylenol as needed. She is advised to quit smoking and drink more water to stay well-hydrated. Thank you very much for allowing me to participate in the care of this nice patient. If I can be of any further assistance to you please do not hesitate to call me at [phone].  Sincerely,   Huston Foley, MD, PhD

## 2015-11-02 ENCOUNTER — Other Ambulatory Visit: Payer: Self-pay | Admitting: Neurology

## 2015-11-02 ENCOUNTER — Ambulatory Visit: Payer: Self-pay | Admitting: Neurology

## 2015-11-09 ENCOUNTER — Other Ambulatory Visit: Payer: Medicaid Other

## 2016-07-10 IMAGING — CR DG CHEST 2V
2 series · 2 of 2 positions shown · non-contrast
Comparison: None.

CLINICAL DATA: Chest pain for 1 day.  Cough

EXAM:
CHEST  2 VIEW

[w chest pa]
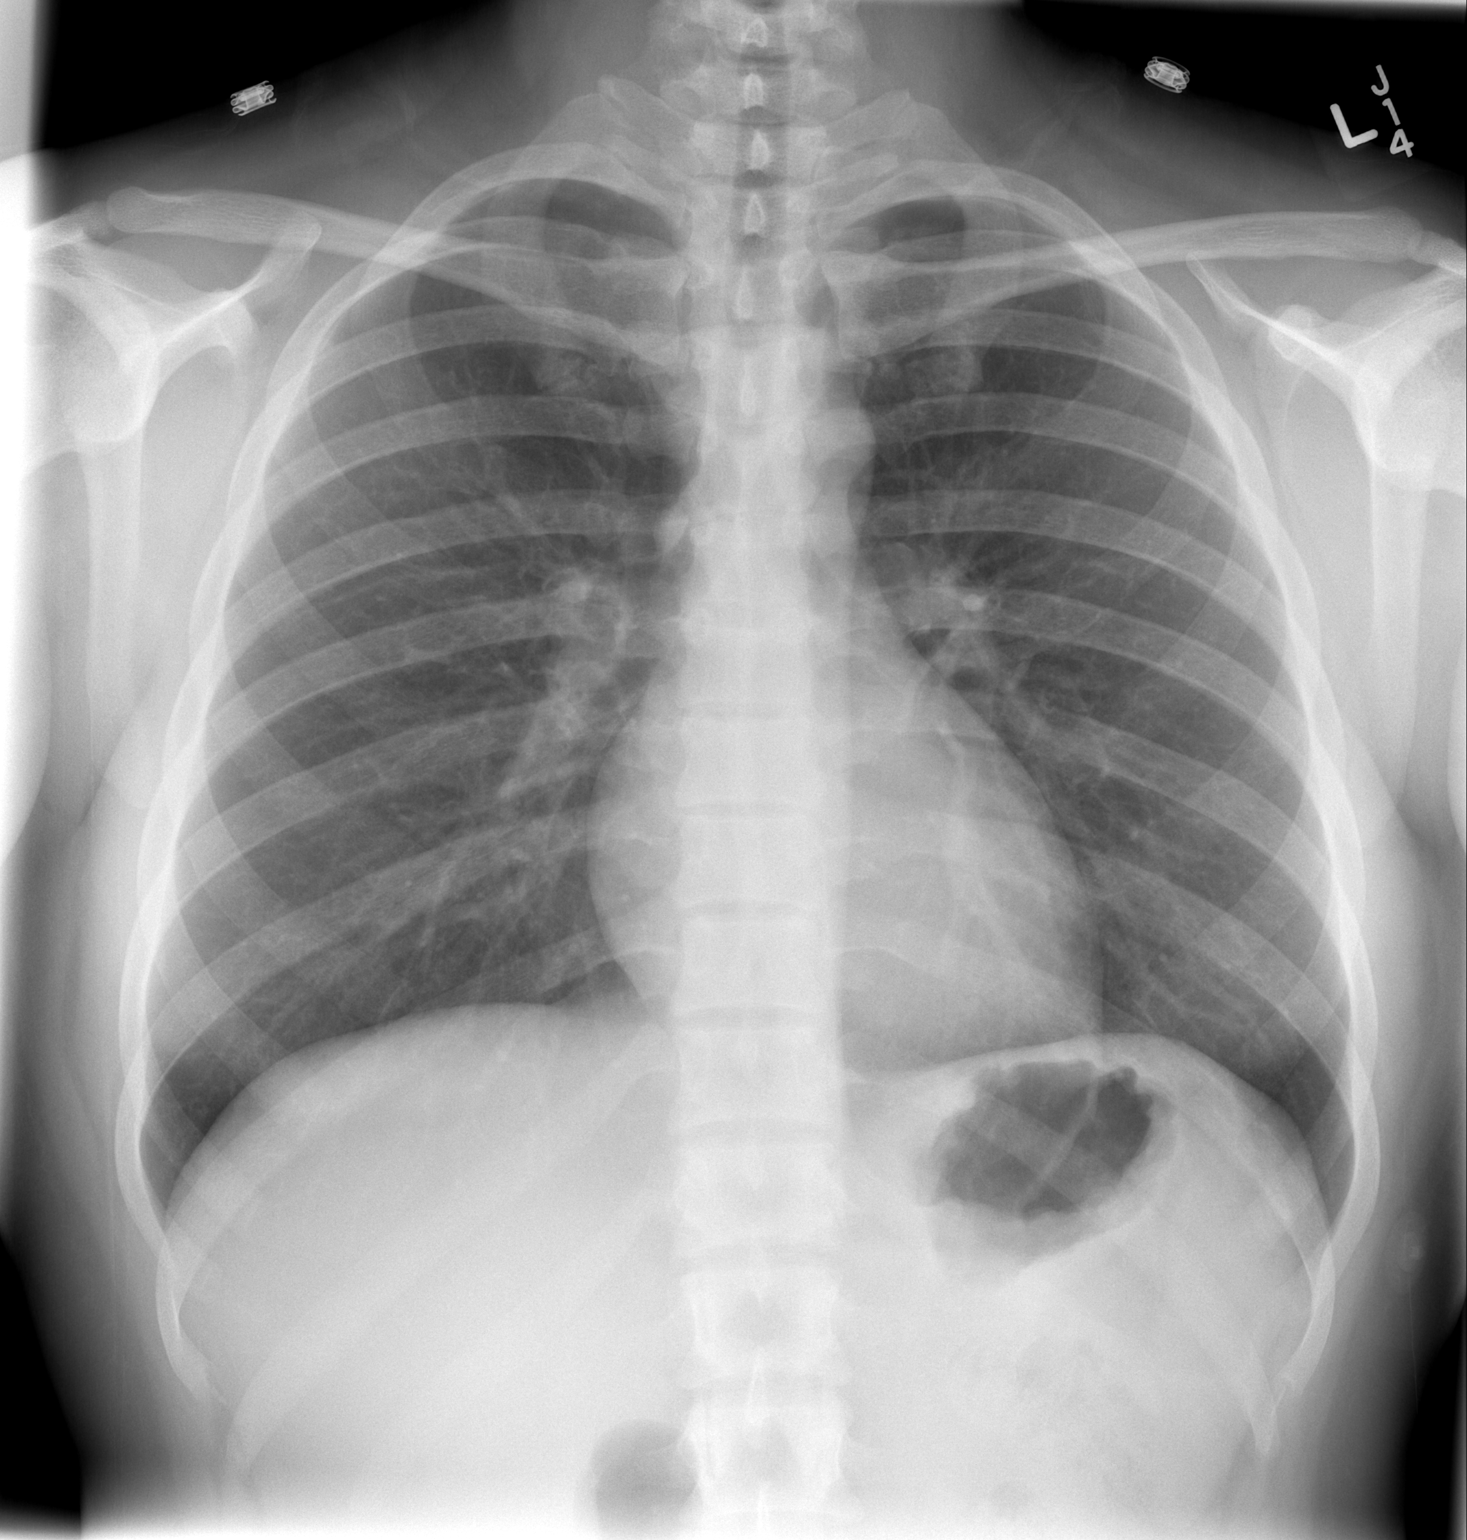

[w chest lat]
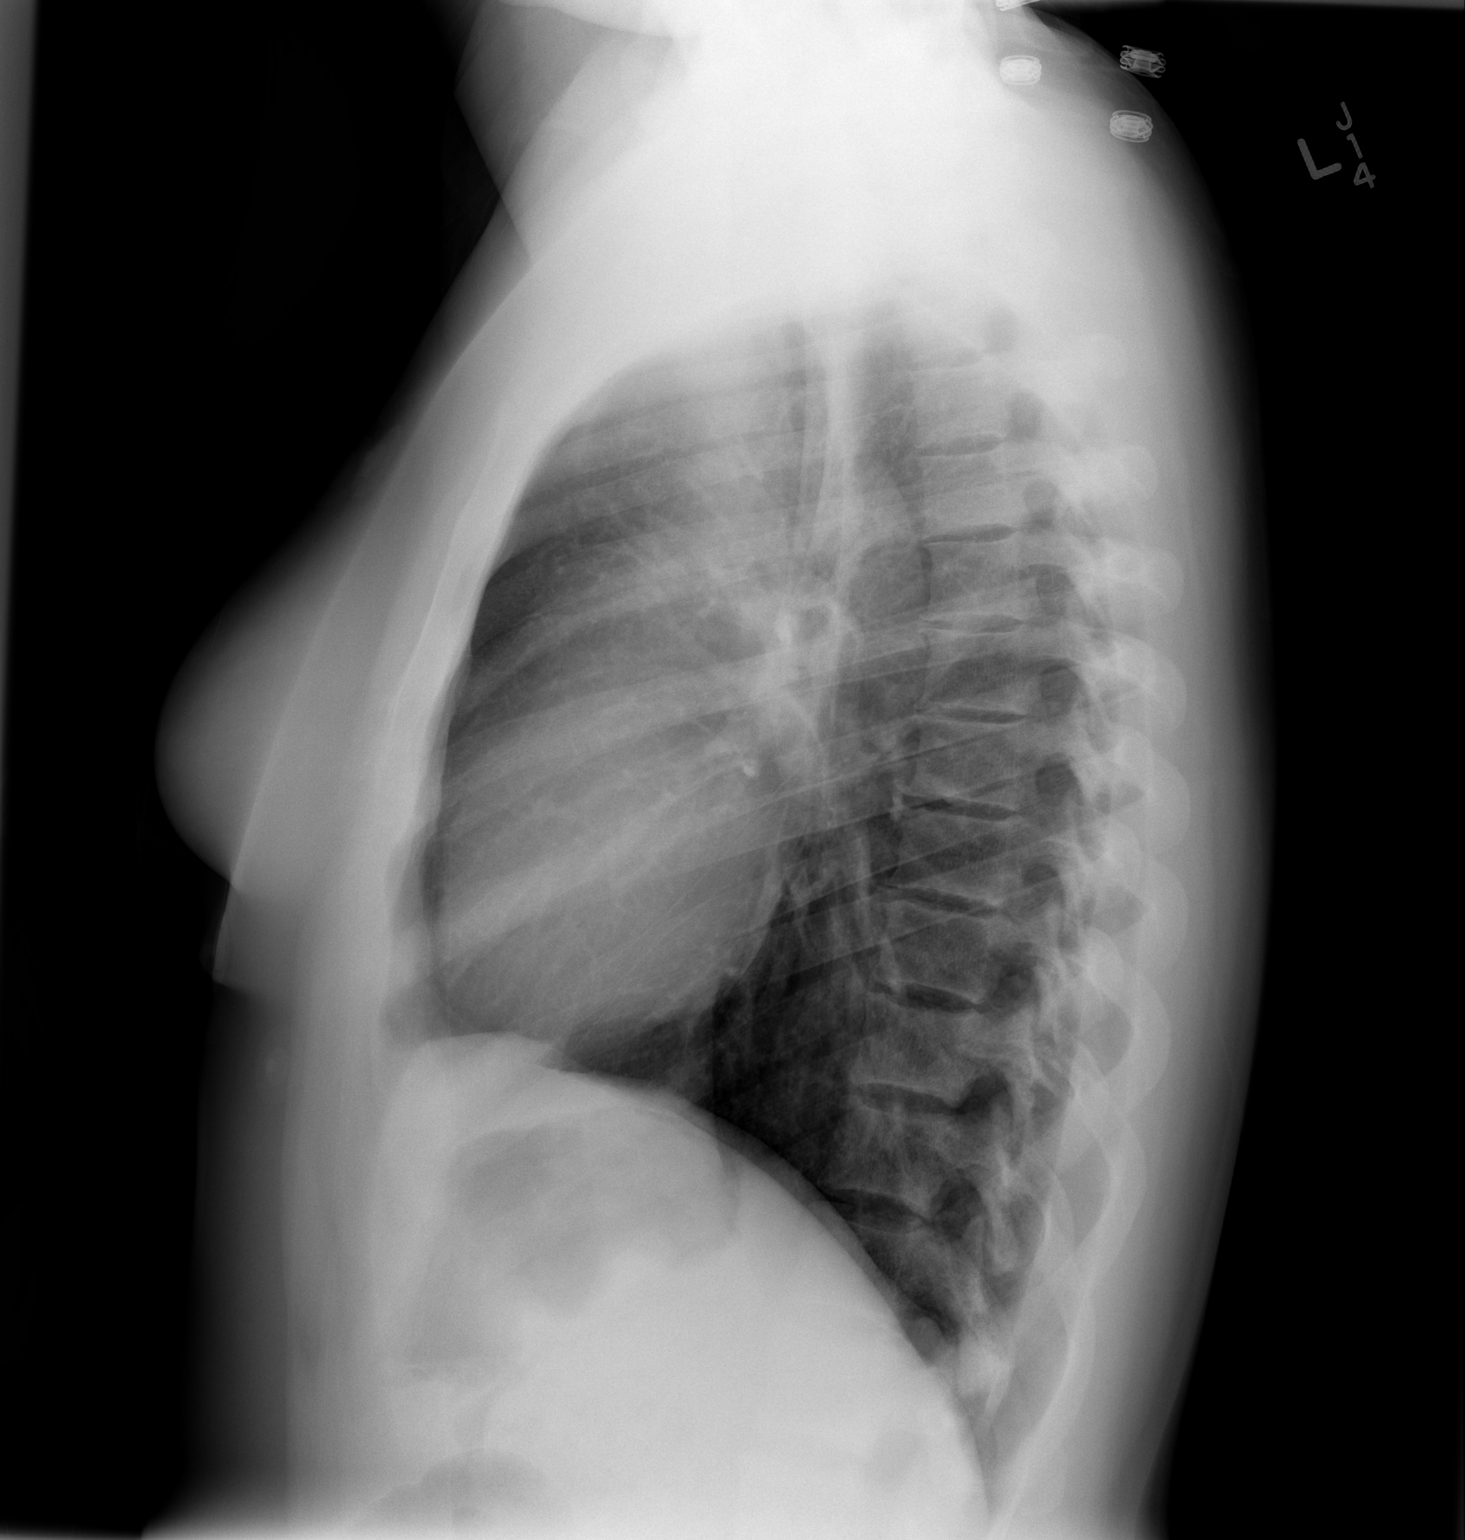

[2 of 2 positions shown; findings below may reference images not displayed]

FINDINGS: Lungs are clear. Heart size and pulmonary vascularity are normal. No
adenopathy. No pneumothorax. No bone lesions.
IMPRESSION: No abnormality noted.
